# Patient Record
Sex: Female | Born: 1970 | Race: White | Hispanic: No | Marital: Single | Smoking: Never smoker
Health system: Southern US, Community
[De-identification: ages and names within clinical notes are randomized; demographics above are authoritative.]

## PROBLEM LIST (undated history)

## (undated) DIAGNOSIS — E785 Hyperlipidemia, unspecified: Secondary | ICD-10-CM

## (undated) DIAGNOSIS — I10 Essential (primary) hypertension: Secondary | ICD-10-CM

## (undated) HISTORY — DX: Essential (primary) hypertension: I10

## (undated) HISTORY — DX: Hyperlipidemia, unspecified: E78.5

---

## 1997-10-06 ENCOUNTER — Other Ambulatory Visit: Admission: RE | Admit: 1997-10-06 | Discharge: 1997-10-06 | Payer: Self-pay | Admitting: Obstetrics and Gynecology

## 1997-11-11 ENCOUNTER — Other Ambulatory Visit: Admission: RE | Admit: 1997-11-11 | Discharge: 1997-11-11 | Payer: Self-pay | Admitting: Obstetrics and Gynecology

## 1998-05-08 ENCOUNTER — Other Ambulatory Visit: Admission: RE | Admit: 1998-05-08 | Discharge: 1998-05-08 | Payer: Self-pay | Admitting: Obstetrics and Gynecology

## 1998-10-07 ENCOUNTER — Other Ambulatory Visit: Admission: RE | Admit: 1998-10-07 | Discharge: 1998-10-07 | Payer: Self-pay | Admitting: Obstetrics and Gynecology

## 1999-11-29 ENCOUNTER — Other Ambulatory Visit: Admission: RE | Admit: 1999-11-29 | Discharge: 1999-11-29 | Payer: Self-pay | Admitting: *Deleted

## 2000-06-29 ENCOUNTER — Encounter: Payer: Self-pay | Admitting: Emergency Medicine

## 2000-06-29 ENCOUNTER — Emergency Department (HOSPITAL_COMMUNITY): Admission: EM | Admit: 2000-06-29 | Discharge: 2000-06-29 | Payer: Self-pay | Admitting: Emergency Medicine

## 2000-12-26 ENCOUNTER — Other Ambulatory Visit: Admission: RE | Admit: 2000-12-26 | Discharge: 2000-12-26 | Payer: Self-pay | Admitting: Obstetrics and Gynecology

## 2001-12-28 ENCOUNTER — Other Ambulatory Visit: Admission: RE | Admit: 2001-12-28 | Discharge: 2001-12-28 | Payer: Self-pay | Admitting: Obstetrics and Gynecology

## 2002-10-30 ENCOUNTER — Inpatient Hospital Stay (HOSPITAL_COMMUNITY): Admission: AD | Admit: 2002-10-30 | Discharge: 2002-11-01 | Payer: Self-pay | Admitting: Obstetrics and Gynecology

## 2002-12-10 ENCOUNTER — Other Ambulatory Visit: Admission: RE | Admit: 2002-12-10 | Discharge: 2002-12-10 | Payer: Self-pay | Admitting: Obstetrics and Gynecology

## 2005-11-14 ENCOUNTER — Inpatient Hospital Stay (HOSPITAL_COMMUNITY): Admission: AD | Admit: 2005-11-14 | Discharge: 2005-11-16 | Payer: Self-pay | Admitting: Obstetrics and Gynecology

## 2011-10-07 ENCOUNTER — Ambulatory Visit (INDEPENDENT_AMBULATORY_CARE_PROVIDER_SITE_OTHER): Payer: BC Managed Care – PPO | Admitting: Licensed Clinical Social Worker

## 2011-10-07 DIAGNOSIS — F4322 Adjustment disorder with anxiety: Secondary | ICD-10-CM

## 2011-10-24 ENCOUNTER — Ambulatory Visit: Payer: BC Managed Care – PPO | Admitting: Family Medicine

## 2011-10-24 VITALS — BP 154/96 | HR 74 | Temp 98.2°F | Resp 20 | Ht 64.0 in | Wt 140.0 lb

## 2011-10-24 DIAGNOSIS — T1490XA Injury, unspecified, initial encounter: Secondary | ICD-10-CM

## 2011-10-24 DIAGNOSIS — IMO0002 Reserved for concepts with insufficient information to code with codable children: Secondary | ICD-10-CM

## 2011-10-24 MED ORDER — HYDROCODONE-ACETAMINOPHEN 5-500 MG PO TABS: 1.0000 | ORAL_TABLET | Freq: Three times a day (TID) | ORAL | Status: AC | PRN

## 2011-10-24 NOTE — Progress Notes (Signed)
  Subjective:    Patient ID: Heidi Hawkins, female    DOB: 10/23/1970, 41 y.o.   MRN: [id number]  HPI Eye irritation x 4 days.  Pt went to [facility] over the weekend.  Noticed L eye irritation since this point No fevers, chills, headhache, LOV.  Pain worse with eye movement.  No trauma per pt.    Review of Systems See HPI, otherwise ROS negative.     Objective:   Physical Exam  Constitutional: She appears well-developed and well-nourished.  HENT:  Head: Normocephalic and atraumatic.  Eyes:         Noted small foreign body in cornea    Positive for and body elimination on Wood's lamp exam       Assessment & Plan:  Will formally refer patient to ophthalmology for removal Patient given short course of narcotics for pain relief Discuss infectious and ophthalmologic red flags Handout given  I agree with diagnosis and plan Robert P. Sandria Bales.D.

## 2011-10-24 NOTE — Patient Instructions (Signed)
Eye, Foreign Body  The term foreign body refers to any object near, on the surface of or in the eye that should not be there. A foreign body may be a small speck of dirt or dust, a hair or eyelash, a splinter or any object.  CAUSES   Foreign bodies can get in the eye by:   Flying pieces of something that was broken or destroyed (debris).   A sudden injury (trauma) to the eye.  SYMPTOMS   Symptoms depend on what the foreign body is and where it is in the eye. The most common locations are:   On the inner surface of the upper or lower eyelids or on the covering of the white part of the eye (conjunctiva). Symptoms in this location are:   Irritating and painful, especially when blinking.   Feeling like something is in the eye.   On the surface of the clear covering on the front of the eye (cornea). A corneal foreign body has symptoms that:   Are painful and irritating since the cornea is very sensitive.   Form small "rust rings" around a metallic foreign body. Metallic foreign bodies stick more firmly to the surface of the cornea.   Inside the eyeball. Infection can happen fast and can be hard to treat with antibiotics. This is an extremely dangerous situation. Foreign bodies inside the eye can threaten vision. A person may even loose their eye. Foreign bodies inside the eye may cause:   Great pain.   Immediate loss of vision.  DIAGNOSIS   Foreign bodies are found during an exam by an eye specialist. Those that are on the eyelids, conjunctiva or cornea are usually (but not always) easily found. When a foreign body is inside the eyeball, a cataract may form almost right away. This makes it hard for an ophthalmologist to find the foreign body. Special tests may be needed, including ultrasound testing, X-rays and CT scans.  TREATMENT    Foreign bodies that are on the eyelids, conjunctiva or cornea are often removed easily and painlessly.   If the foreign body has caused a scratch or abrasion of the cornea,  antibiotic drops, ointments and/or a tight patch called a "pressure patch" may be needed. Follow-up exams will be needed for several days until the abrasion heals.   Surgery is needed right away if the foreign body is inside the eyeball. This is a medical emergency. An antibiotic therapy will likely be given to stop an infection.  HOME CARE INSTRUCTIONS   The use of eye patches is not universal. Their use varies from state to state and from caregiver to caregiver.  If an eye patch was applied:   Keep the eye patch on for as long as directed by your caregiver until the follow-up appointment.   Do not remove the patch to put in medications unless instructed to do so. When replacing the patch, retape it as it was before. Follow the same procedure if the patch becomes loose.   WARNING: Do not drive or operate machinery while the eye is patched. The ability to judge distances will be impaired.   Only take over-the-counter or prescription medicines for pain, discomfort or fever as directed by the caregiver.  If no eye patch was applied:   Keep the eye closed as much as possible. Do not rub the eye.   Wear dark glasses as needed to protect the eyes from bright light.   Do not wear contact lenses until the eye   feels normal again, or as instructed.   Wear protective eye covering if there is a risk of eye injury. This is important when working with high speed tools.   Only take over-the-counter or prescription medicines for pain, discomfort or fever as directed by the caregiver.  SEEK IMMEDIATE MEDICAL CARE IF:    Pain increases in the eye or the vision changes.   You or your child has problems with the eye patch.   The injury to the eye appears to be getting larger.   There is discharge from the injured eye.   Swelling and/or soreness (inflammation) develops around the affected eye.   You or your child has an oral temperature above 102 F (38.9 C), not controlled by medicine.   Your baby is older than 3  months with a rectal temperature of 102 F (38.9 C) or higher.   Your baby is 3 months old or younger with a rectal temperature of 100.4 F (38 C) or higher.  MAKE SURE YOU:    Understand these instructions.   Will watch your condition.   Will get help right away if you are not doing well or get worse.  Document Released: 05/09/2005 Document Revised: 04/28/2011 Document Reviewed: 12/27/2007  ExitCare Patient Information 2012 [employer].

## 2013-04-04 ENCOUNTER — Other Ambulatory Visit: Payer: Self-pay

## 2013-04-04 DIAGNOSIS — Z1231 Encounter for screening mammogram for malignant neoplasm of breast: Secondary | ICD-10-CM

## 2013-05-03 ENCOUNTER — Ambulatory Visit
Admission: RE | Admit: 2013-05-03 | Discharge: 2013-05-03 | Disposition: A | Discharge status: Home / Self Care | Payer: 59 | Source: Ambulatory Visit

## 2013-05-03 DIAGNOSIS — Z1231 Encounter for screening mammogram for malignant neoplasm of breast: Secondary | ICD-10-CM

## 2016-06-04 DIAGNOSIS — Z23 Encounter for immunization: Secondary | ICD-10-CM | POA: Diagnosis not present

## 2016-11-10 DIAGNOSIS — I10 Essential (primary) hypertension: Secondary | ICD-10-CM | POA: Diagnosis not present

## 2016-12-06 DIAGNOSIS — I10 Essential (primary) hypertension: Secondary | ICD-10-CM | POA: Diagnosis not present

## 2016-12-16 DIAGNOSIS — Z1231 Encounter for screening mammogram for malignant neoplasm of breast: Secondary | ICD-10-CM | POA: Diagnosis not present

## 2016-12-20 ENCOUNTER — Other Ambulatory Visit: Payer: Self-pay | Admitting: Obstetrics and Gynecology

## 2016-12-20 DIAGNOSIS — R928 Other abnormal and inconclusive findings on diagnostic imaging of breast: Secondary | ICD-10-CM

## 2016-12-23 ENCOUNTER — Ambulatory Visit
Admission: RE | Admit: 2016-12-23 | Discharge: 2016-12-23 | Disposition: A | Discharge status: Home / Self Care | Payer: 59 | Source: Ambulatory Visit | Attending: Obstetrics and Gynecology | Admitting: Obstetrics and Gynecology

## 2016-12-23 DIAGNOSIS — R928 Other abnormal and inconclusive findings on diagnostic imaging of breast: Secondary | ICD-10-CM

## 2016-12-23 DIAGNOSIS — R922 Inconclusive mammogram: Secondary | ICD-10-CM | POA: Diagnosis not present

## 2016-12-23 DIAGNOSIS — N6489 Other specified disorders of breast: Secondary | ICD-10-CM | POA: Diagnosis not present

## 2017-02-01 DIAGNOSIS — R8299 Other abnormal findings in urine: Secondary | ICD-10-CM | POA: Diagnosis not present

## 2017-02-01 DIAGNOSIS — I10 Essential (primary) hypertension: Secondary | ICD-10-CM | POA: Diagnosis not present

## 2017-02-01 DIAGNOSIS — N390 Urinary tract infection, site not specified: Secondary | ICD-10-CM | POA: Diagnosis not present

## 2017-02-08 DIAGNOSIS — E784 Other hyperlipidemia: Secondary | ICD-10-CM | POA: Diagnosis not present

## 2017-02-08 DIAGNOSIS — Z1389 Encounter for screening for other disorder: Secondary | ICD-10-CM | POA: Diagnosis not present

## 2017-02-08 DIAGNOSIS — I10 Essential (primary) hypertension: Secondary | ICD-10-CM | POA: Diagnosis not present

## 2017-02-08 DIAGNOSIS — Z0000 Encounter for general adult medical examination without abnormal findings: Secondary | ICD-10-CM | POA: Diagnosis not present

## 2017-06-06 DIAGNOSIS — Z01419 Encounter for gynecological examination (general) (routine) without abnormal findings: Secondary | ICD-10-CM | POA: Diagnosis not present

## 2017-07-14 DIAGNOSIS — D225 Melanocytic nevi of trunk: Secondary | ICD-10-CM | POA: Diagnosis not present

## 2017-07-14 DIAGNOSIS — L821 Other seborrheic keratosis: Secondary | ICD-10-CM | POA: Diagnosis not present

## 2017-07-14 DIAGNOSIS — D2261 Melanocytic nevi of right upper limb, including shoulder: Secondary | ICD-10-CM | POA: Diagnosis not present

## 2017-12-18 DIAGNOSIS — Z1231 Encounter for screening mammogram for malignant neoplasm of breast: Secondary | ICD-10-CM | POA: Diagnosis not present

## 2018-02-12 DIAGNOSIS — R82998 Other abnormal findings in urine: Secondary | ICD-10-CM | POA: Diagnosis not present

## 2018-02-12 DIAGNOSIS — Z0000 Encounter for general adult medical examination without abnormal findings: Secondary | ICD-10-CM | POA: Diagnosis not present

## 2018-02-19 DIAGNOSIS — I10 Essential (primary) hypertension: Secondary | ICD-10-CM | POA: Diagnosis not present

## 2018-02-19 DIAGNOSIS — E7849 Other hyperlipidemia: Secondary | ICD-10-CM | POA: Diagnosis not present

## 2018-02-19 DIAGNOSIS — Z23 Encounter for immunization: Secondary | ICD-10-CM | POA: Diagnosis not present

## 2018-02-19 DIAGNOSIS — Z1389 Encounter for screening for other disorder: Secondary | ICD-10-CM | POA: Diagnosis not present

## 2018-02-19 DIAGNOSIS — Z0000 Encounter for general adult medical examination without abnormal findings: Secondary | ICD-10-CM | POA: Diagnosis not present

## 2018-07-20 DIAGNOSIS — D2261 Melanocytic nevi of right upper limb, including shoulder: Secondary | ICD-10-CM | POA: Diagnosis not present

## 2018-07-20 DIAGNOSIS — D225 Melanocytic nevi of trunk: Secondary | ICD-10-CM | POA: Diagnosis not present

## 2018-07-20 DIAGNOSIS — L821 Other seborrheic keratosis: Secondary | ICD-10-CM | POA: Diagnosis not present

## 2018-08-07 DIAGNOSIS — H0011 Chalazion right upper eyelid: Secondary | ICD-10-CM | POA: Diagnosis not present

## 2019-04-03 ENCOUNTER — Other Ambulatory Visit: Payer: Self-pay

## 2019-04-03 DIAGNOSIS — Z20822 Contact with and (suspected) exposure to covid-19: Secondary | ICD-10-CM

## 2019-04-05 LAB — NOVEL CORONAVIRUS, NAA: SARS-CoV-2, NAA: NOT DETECTED

## 2019-04-29 ENCOUNTER — Other Ambulatory Visit: Payer: Self-pay

## 2019-04-29 DIAGNOSIS — Z20822 Contact with and (suspected) exposure to covid-19: Secondary | ICD-10-CM

## 2019-04-30 LAB — NOVEL CORONAVIRUS, NAA: SARS-CoV-2, NAA: NOT DETECTED

## 2019-05-13 ENCOUNTER — Ambulatory Visit: Payer: 59 | Attending: Internal Medicine

## 2019-05-13 DIAGNOSIS — U071 COVID-19: Secondary | ICD-10-CM

## 2019-05-13 DIAGNOSIS — R238 Other skin changes: Secondary | ICD-10-CM

## 2019-05-14 LAB — NOVEL CORONAVIRUS, NAA: SARS-CoV-2, NAA: NOT DETECTED

## 2019-06-10 ENCOUNTER — Ambulatory Visit: Payer: 59 | Attending: Internal Medicine

## 2019-06-10 ENCOUNTER — Other Ambulatory Visit: Payer: 59

## 2019-06-10 DIAGNOSIS — Z20822 Contact with and (suspected) exposure to covid-19: Secondary | ICD-10-CM

## 2019-06-12 LAB — NOVEL CORONAVIRUS, NAA: SARS-CoV-2, NAA: NOT DETECTED

## 2019-10-18 ENCOUNTER — Other Ambulatory Visit: Payer: Self-pay | Admitting: Obstetrics and Gynecology

## 2019-10-18 DIAGNOSIS — N6002 Solitary cyst of left breast: Secondary | ICD-10-CM

## 2019-11-04 ENCOUNTER — Other Ambulatory Visit: Payer: Self-pay

## 2019-11-04 ENCOUNTER — Ambulatory Visit
Admission: RE | Admit: 2019-11-04 | Discharge: 2019-11-04 | Disposition: A | Discharge status: Home / Self Care | Payer: 59 | Source: Ambulatory Visit | Attending: Obstetrics and Gynecology | Admitting: Obstetrics and Gynecology

## 2019-11-04 ENCOUNTER — Other Ambulatory Visit: Payer: Self-pay | Admitting: Obstetrics and Gynecology

## 2019-11-04 DIAGNOSIS — N6002 Solitary cyst of left breast: Secondary | ICD-10-CM

## 2019-11-07 ENCOUNTER — Other Ambulatory Visit: Payer: Self-pay

## 2019-11-07 ENCOUNTER — Ambulatory Visit
Admission: RE | Admit: 2019-11-07 | Discharge: 2019-11-07 | Disposition: A | Discharge status: Home / Self Care | Payer: 59 | Source: Ambulatory Visit | Attending: Obstetrics and Gynecology | Admitting: Obstetrics and Gynecology

## 2019-11-07 DIAGNOSIS — N6002 Solitary cyst of left breast: Secondary | ICD-10-CM

## 2020-03-27 DIAGNOSIS — Z1152 Encounter for screening for COVID-19: Secondary | ICD-10-CM | POA: Diagnosis not present

## 2020-04-15 DIAGNOSIS — Z1212 Encounter for screening for malignant neoplasm of rectum: Secondary | ICD-10-CM | POA: Diagnosis not present

## 2020-08-25 DIAGNOSIS — E785 Hyperlipidemia, unspecified: Secondary | ICD-10-CM | POA: Diagnosis not present

## 2020-08-25 DIAGNOSIS — I10 Essential (primary) hypertension: Secondary | ICD-10-CM | POA: Diagnosis not present

## 2020-10-12 IMAGING — MG DIGITAL DIAGNOSTIC BILAT W/ TOMO W/ CAD
6 of 10 series · 6 of 30 positions shown · non-contrast
Comparison: Previous exam(s).

CLINICAL DATA: 48-year-old presenting with a painless palpable lump
involving the OUTER LEFT breast for approximately 1 month. Personal
history of LEFT breast cysts. Annual evaluation, RIGHT breast.

EXAM:
DIGITAL DIAGNOSTIC BILATERAL MAMMOGRAM WITH CAD AND TOMO
ULTRASOUND LEFT BREAST

[L MLO synth-2D]
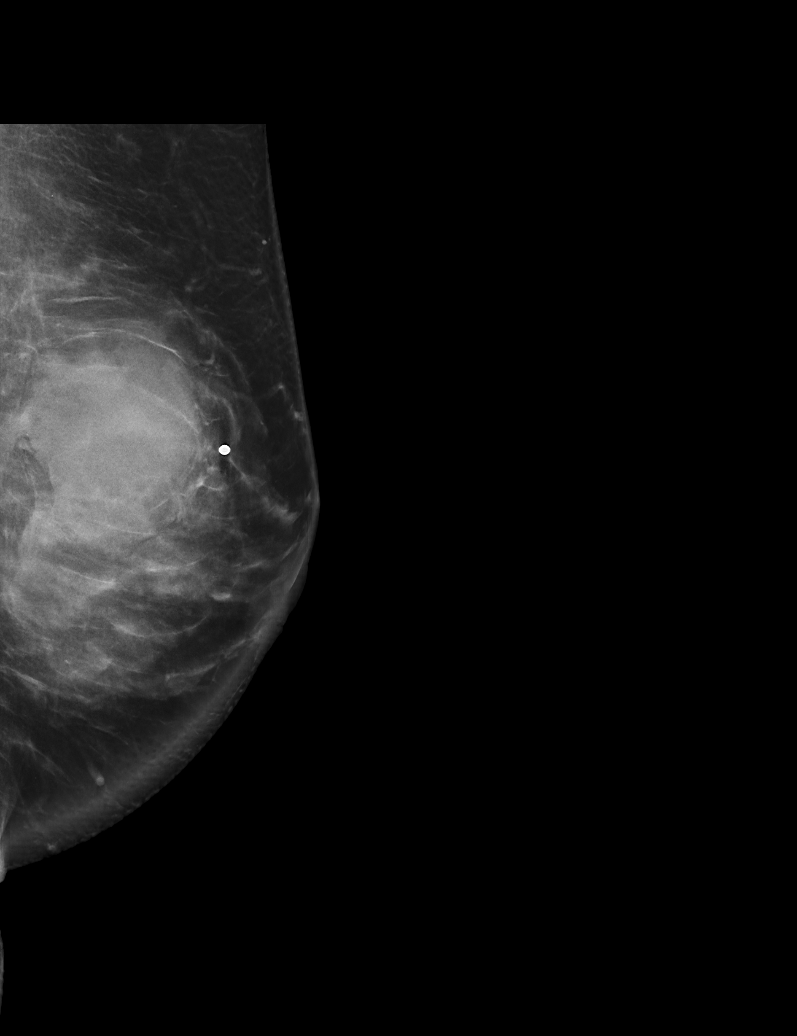

[L CC synth-2D (1 of 2)]
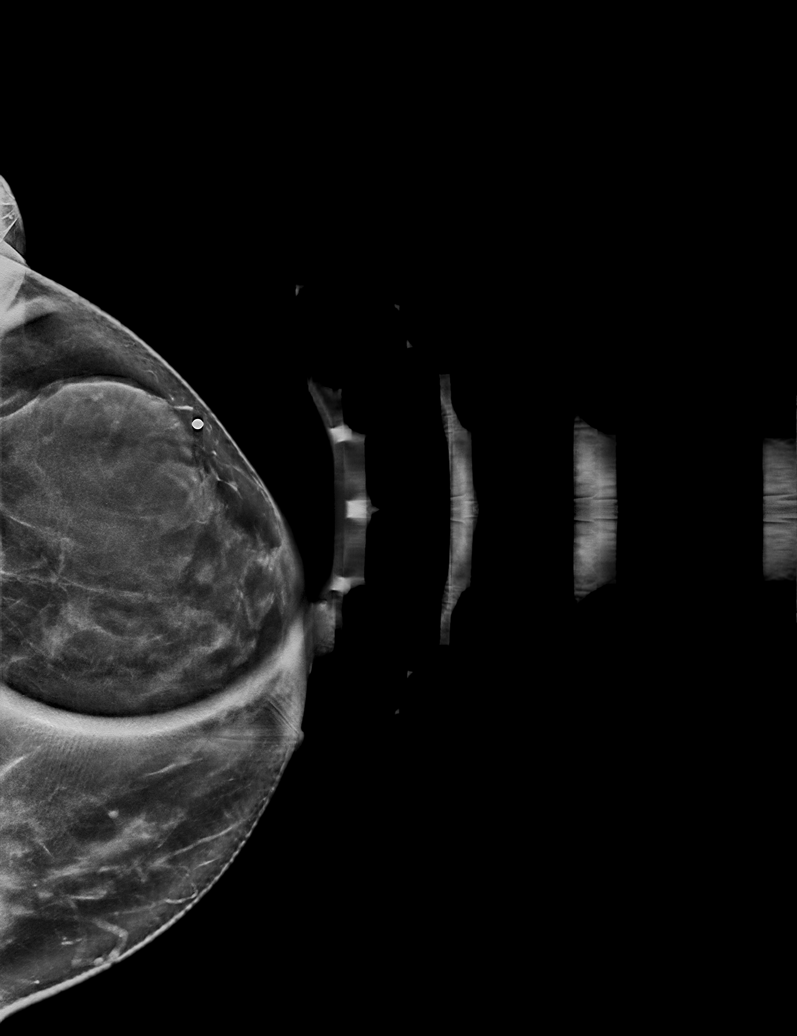

[R MLO synth-2D]
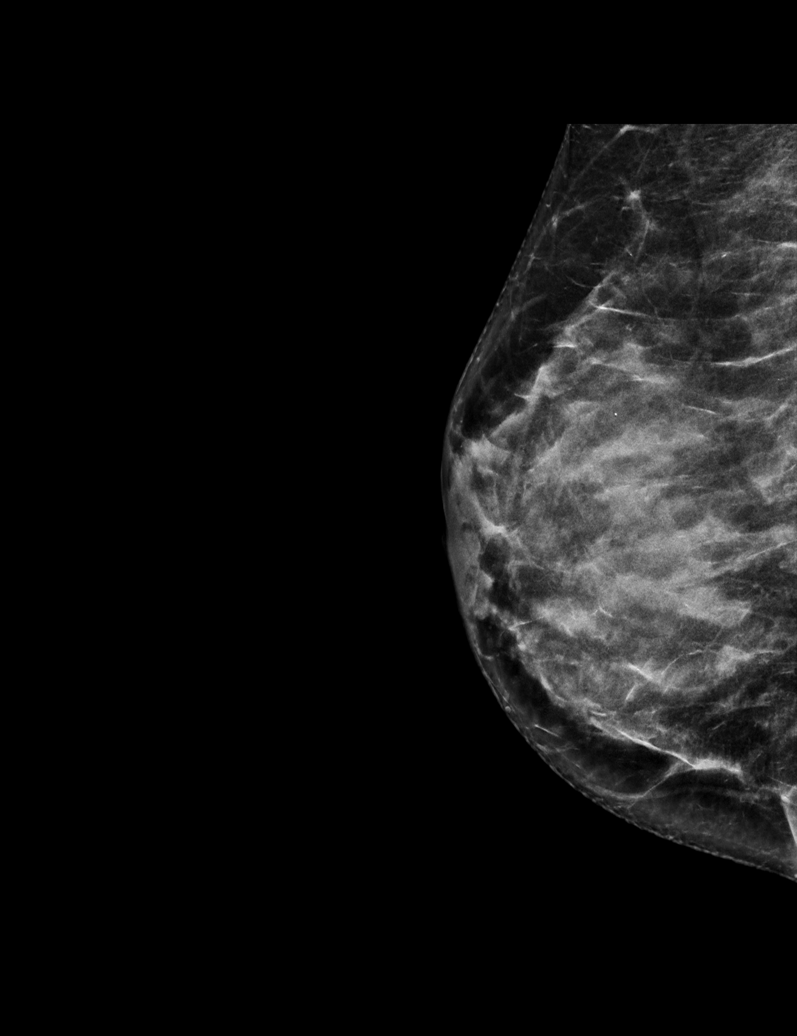

[L CC synth-2D (2 of 2)]
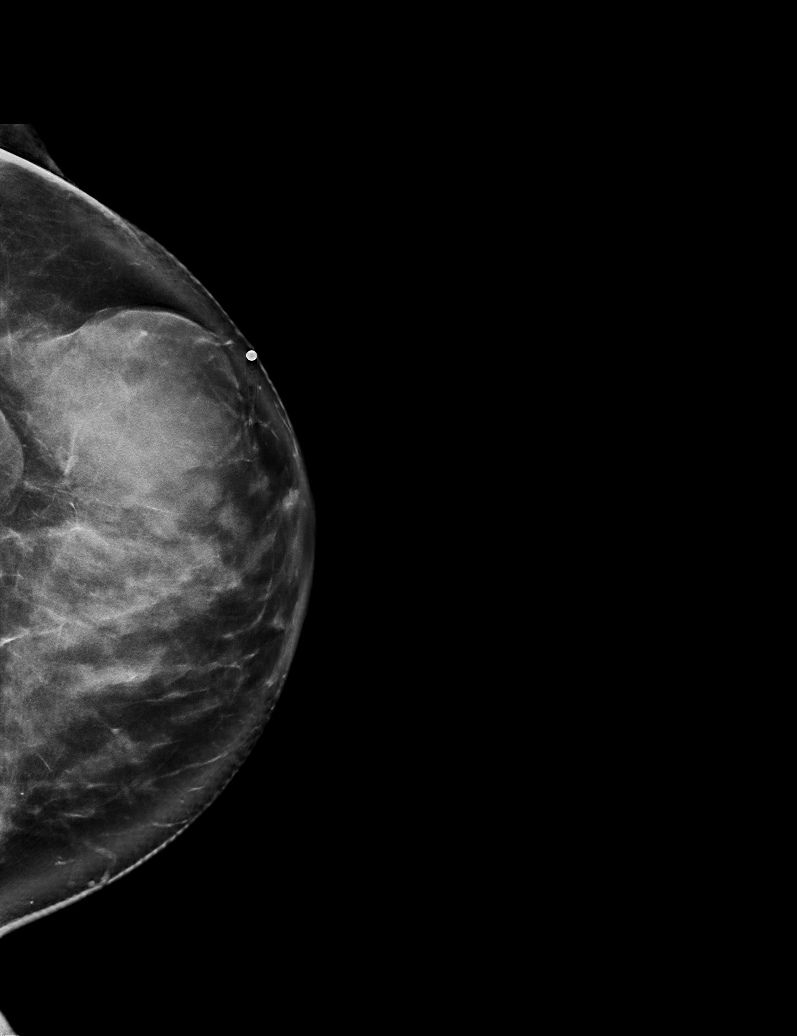

[R CC synth-2D]
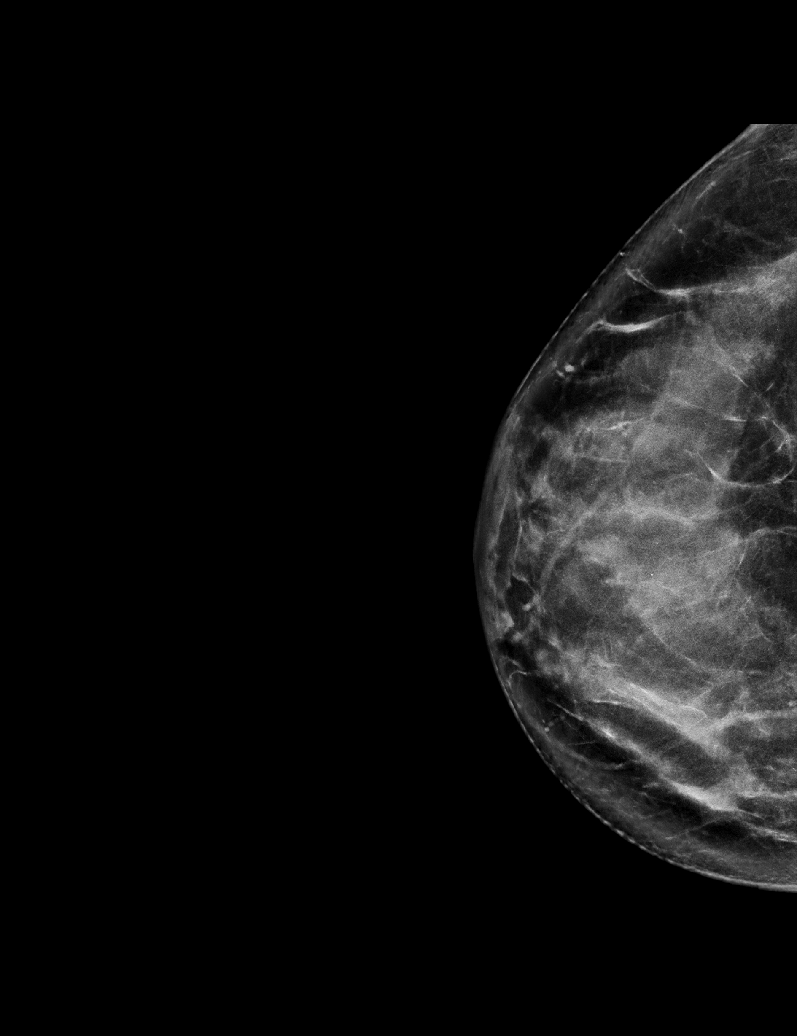

[R MLO tomo · tomo slice 31/61.0]
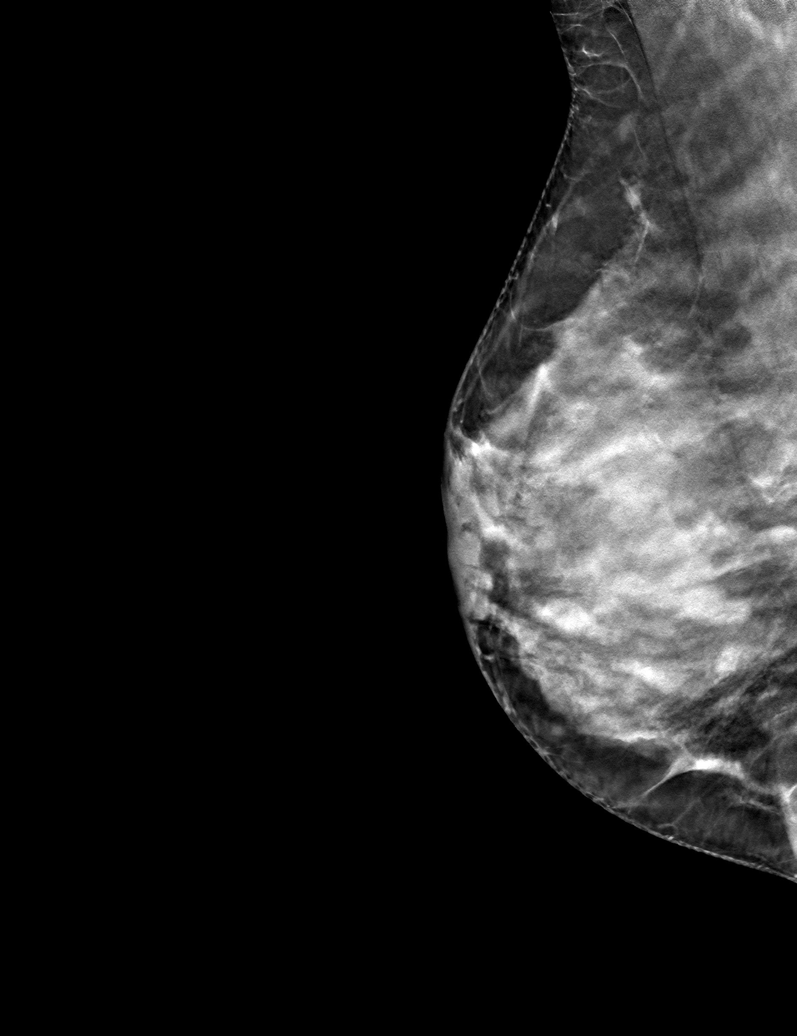

[6 of 30 positions shown; findings below may reference images not displayed]

ACR Breast Density Category d: The breast tissue is extremely dense,
which lowers the sensitivity of mammography.
FINDINGS: Tomosynthesis and synthesized full field CC and MLO views of both
breasts were obtained. Tomosynthesis and synthesized spot
compression tangential view of the area of concern in the LEFT
breast was also obtained.

Corresponding to the palpable concern in the OUTER LEFT breast is a
partially obscured isodense mass measuring approximately 5 cm whose
visible margins are circumscribed. There is no associated
architectural distortion or suspicious calcifications. This is in a
similar location to the previously identified cysts in 0182. No
suspicious findings elsewhere in the LEFT breast.

No findings suspicious for malignancy in the RIGHT breast.

Mammographic images were processed with CAD.

On correlative physical exam, there is a freely mobile palpable
approximate 4-5 cm mass in the OUTER RIGHT breast which causes a
visible bulge on the patient's skin.

Targeted LEFT breast ultrasound is performed, showing a benign
simple cyst at the 3 o'clock position approximately 4 cm from nipple
extending from anterior to posterior depth, measuring approximately
4.6 x 2.5 x 4.0 cm, demonstrating posterior acoustic enhancement and
no internal color Doppler flow, corresponding to palpable concern.
No suspicious solid mass is identified.
IMPRESSION: 1. No mammographic or sonographic evidence of malignancy involving
the LEFT breast.
2. No mammographic evidence of malignancy involving the RIGHT
breast.
3. Large benign simple cyst involving the OUTER LEFT breast,
measuring approximately 4.6 cm, accounting for the palpable concern.

RECOMMENDATION:
1.  Screening mammogram in one year.(Code:Z3-L-EMU)
2. Due to the large size of the LEFT breast cyst, the patient
requests cyst aspiration. This has been scheduled at her
convenience.

I have discussed the findings and recommendations with the patient.
If applicable, a reminder letter will be sent to the patient
regarding the next appointment.

BI-RADS CATEGORY  2: Benign.

## 2020-11-02 DIAGNOSIS — Z1231 Encounter for screening mammogram for malignant neoplasm of breast: Secondary | ICD-10-CM | POA: Diagnosis not present

## 2020-11-02 LAB — HM MAMMOGRAPHY

## 2021-03-31 DIAGNOSIS — E785 Hyperlipidemia, unspecified: Secondary | ICD-10-CM | POA: Diagnosis not present

## 2021-03-31 DIAGNOSIS — Z0000 Encounter for general adult medical examination without abnormal findings: Secondary | ICD-10-CM | POA: Diagnosis not present

## 2021-04-07 DIAGNOSIS — Z23 Encounter for immunization: Secondary | ICD-10-CM | POA: Diagnosis not present

## 2021-04-07 DIAGNOSIS — Z1331 Encounter for screening for depression: Secondary | ICD-10-CM | POA: Diagnosis not present

## 2021-04-07 DIAGNOSIS — R82998 Other abnormal findings in urine: Secondary | ICD-10-CM | POA: Diagnosis not present

## 2021-04-07 DIAGNOSIS — I10 Essential (primary) hypertension: Secondary | ICD-10-CM | POA: Diagnosis not present

## 2021-04-07 DIAGNOSIS — Z0000 Encounter for general adult medical examination without abnormal findings: Secondary | ICD-10-CM | POA: Diagnosis not present

## 2021-04-07 DIAGNOSIS — Z1339 Encounter for screening examination for other mental health and behavioral disorders: Secondary | ICD-10-CM | POA: Diagnosis not present

## 2021-09-30 ENCOUNTER — Encounter: Payer: Self-pay | Admitting: Internal Medicine

## 2021-11-25 ENCOUNTER — Encounter: Payer: Self-pay | Admitting: Internal Medicine

## 2021-12-16 ENCOUNTER — Ambulatory Visit (AMBULATORY_SURGERY_CENTER): Payer: Self-pay | Admitting: *Deleted

## 2021-12-16 VITALS — Ht 64.0 in | Wt 145.0 lb

## 2021-12-16 DIAGNOSIS — Z1231 Encounter for screening mammogram for malignant neoplasm of breast: Secondary | ICD-10-CM | POA: Diagnosis not present

## 2021-12-16 DIAGNOSIS — Z1211 Encounter for screening for malignant neoplasm of colon: Secondary | ICD-10-CM

## 2021-12-16 DIAGNOSIS — Z124 Encounter for screening for malignant neoplasm of cervix: Secondary | ICD-10-CM | POA: Diagnosis not present

## 2021-12-16 DIAGNOSIS — Z01419 Encounter for gynecological examination (general) (routine) without abnormal findings: Secondary | ICD-10-CM | POA: Diagnosis not present

## 2021-12-16 DIAGNOSIS — Z6824 Body mass index (BMI) 24.0-24.9, adult: Secondary | ICD-10-CM | POA: Diagnosis not present

## 2021-12-16 LAB — HM MAMMOGRAPHY

## 2021-12-16 MED ORDER — NA SULFATE-K SULFATE-MG SULF 17.5-3.13-1.6 GM/177ML PO SOLN: 1.0000 | Freq: Once | ORAL | 0 refills | Status: AC

## 2021-12-16 NOTE — Progress Notes (Signed)
No egg or soy allergy known to patient  No issues known to pt with past sedation with any surgeries or procedures Patient denies ever being told they had issues or difficulty with intubation  No FH of Malignant Hyperthermia Pt is not on diet pills Pt is not on  home 02  Pt is not on blood thinners  Pt denies issues with constipation  No A fib or A flutter Have any cardiac testing pending--no Pt instructed to use [email or web address] or GoodRx for a price reduction on prep   

## 2021-12-23 LAB — HM PAP SMEAR

## 2021-12-27 ENCOUNTER — Telehealth: Payer: Self-pay | Admitting: Internal Medicine

## 2021-12-27 NOTE — Telephone Encounter (Signed)
Done

## 2021-12-27 NOTE — Telephone Encounter (Signed)
Patient rescheduled her colonoscopy and requested new instructions to be sent through her [facility]

## 2021-12-29 ENCOUNTER — Encounter: Payer: Self-pay | Admitting: Internal Medicine

## 2022-01-05 ENCOUNTER — Encounter: Payer: Self-pay | Admitting: Internal Medicine

## 2022-01-05 ENCOUNTER — Ambulatory Visit (AMBULATORY_SURGERY_CENTER): Payer: BC Managed Care – PPO | Admitting: Internal Medicine

## 2022-01-05 VITALS — BP 129/84 | HR 69 | Temp 98.4°F | Resp 13 | Ht 64.0 in | Wt 145.0 lb

## 2022-01-05 DIAGNOSIS — Z1211 Encounter for screening for malignant neoplasm of colon: Secondary | ICD-10-CM

## 2022-01-05 DIAGNOSIS — R933 Abnormal findings on diagnostic imaging of other parts of digestive tract: Secondary | ICD-10-CM | POA: Diagnosis not present

## 2022-01-05 MED ORDER — SODIUM CHLORIDE 0.9 % IV SOLN: 500.0000 mL | Freq: Once | INTRAVENOUS | Status: DC

## 2022-01-05 NOTE — Progress Notes (Signed)
Report given to PACU, vss 

## 2022-01-05 NOTE — Progress Notes (Signed)
GASTROENTEROLOGY PROCEDURE H&P NOTE   Primary Care Physician: Ginger Organ., MD    Reason for Procedure:  Colon cancer screening  Plan:    Colonoscopy  Patient is appropriate for endoscopic procedure(s) in the ambulatory ([place]) setting.  The nature of the procedure, as well as the risks, benefits, and alternatives were carefully and thoroughly reviewed with the patient. Ample time for discussion and questions allowed. The patient understood, was satisfied, and agreed to proceed.     HPI: Heidi Hawkins is a 51 y.o. female who presents for screening colonoscopy.  Medical history as below.  Tolerated the prep.  No recent chest pain or shortness of breath.  No abdominal pain today.  Past Medical History:  Diagnosis Date   Hyperlipidemia    Hypertension     History reviewed. No pertinent surgical history.  Prior to Admission medications   Medication Sig Start Date End Date Taking? Authorizing Provider  hydrochlorothiazide (HYDRODIURIL) 12.5 MG tablet 1 tablet in the morning Oral Once a day for 90 days   Yes [provider]  olmesartan (BENICAR) 20 MG tablet TAKE 1 TABLET BY MOUTH EVERY DAY for 90 days   Yes [provider]  ibuprofen (ADVIL,MOTRIN) 100 MG chewable tablet Chew 200 mg by mouth every 8 (eight) hours as needed.    [provider]    Current Outpatient Medications  Medication Sig Dispense Refill   hydrochlorothiazide (HYDRODIURIL) 12.5 MG tablet 1 tablet in the morning Oral Once a day for 90 days     olmesartan (BENICAR) 20 MG tablet TAKE 1 TABLET BY MOUTH EVERY DAY for 90 days     ibuprofen (ADVIL,MOTRIN) 100 MG chewable tablet Chew 200 mg by mouth every 8 (eight) hours as needed.     Current Facility-Administered Medications  Medication Dose Route Frequency Provider Last Rate Last Admin   0.9 %  sodium chloride infusion  500 mL Intravenous Once Eustacio Ellen, Lajuan Lines, MD        Allergies as of 01/05/2022 - Review Complete  01/05/2022  Allergen Reaction Noted   Allegra [fexofenadine hcl] Swelling 10/24/2011   Claritin [loratadine] Swelling 10/24/2011   Penicillins  10/24/2011   Sulfa antibiotics  10/24/2011    Family History  Problem Relation Age of Onset   Colon cancer Neg Hx    Stomach cancer Neg Hx    Esophageal cancer Neg Hx     Social History   Socioeconomic History   Marital status: Single    Spouse name: Not on file   Number of children: Not on file   Years of education: Not on file   Highest education level: Not on file  Occupational History   Not on file  Tobacco Use   Smoking status: Never   Smokeless tobacco: Not on file  Substance and Sexual Activity   Alcohol use: Yes    Alcohol/week: 1.0 standard drink of alcohol    Types: 1 Glasses of wine per week   Drug use: Not Currently   Sexual activity: Not on file  Other Topics Concern   Not on file  Social History Narrative   Not on file   Social Determinants of Health   Financial Resource Strain: Not on file  Food Insecurity: Not on file  Transportation Needs: Not on file  Physical Activity: Not on file  Stress: Not on file  Social Connections: Not on file  Intimate Partner Violence: Not on file    Physical Exam: Vital signs in last  24 hours: '@BP'$  128/70   Pulse 87   Temp 98.4 F (36.9 C)   Ht '5\' 4"'$  (1.626 m)   Wt 145 lb (65.8 kg)   SpO2 97%   BMI 24.89 kg/m  GEN: NAD EYE: Sclerae anicteric ENT: MMM CV: Non-tachycardic Pulm: CTA b/l GI: Soft, NT/ND NEURO:  Alert & Oriented x 3   Zenovia Jarred, MD [facility]  01/05/2022 2:54 PM

## 2022-01-05 NOTE — Progress Notes (Signed)
Pt's states no medical or surgical changes since previsit or office visit. 

## 2022-01-05 NOTE — Patient Instructions (Signed)
Repeat colonoscopy in 10 years for screening purposes.   YOU HAD AN ENDOSCOPIC PROCEDURE TODAY AT [facility]:   Refer to the procedure report that was given to you for any specific questions about what was found during the examination.  If the procedure report does not answer your questions, please call your gastroenterologist to clarify.  If you requested that your care partner not be given the details of your procedure findings, then the procedure report has been included in a sealed envelope for you to review at your convenience later.  YOU SHOULD EXPECT: Some feelings of bloating in the abdomen. Passage of more gas than usual.  Walking can help get rid of the air that was put into your GI tract during the procedure and reduce the bloating. If you had a lower endoscopy (such as a colonoscopy or flexible sigmoidoscopy) you may notice spotting of blood in your stool or on the toilet paper. If you underwent a bowel prep for your procedure, you may not have a normal bowel movement for a few days.  Please Note:  You might notice some irritation and congestion in your nose or some drainage.  This is from the oxygen used during your procedure.  There is no need for concern and it should clear up in a day or so.  SYMPTOMS TO REPORT IMMEDIATELY:  Following lower endoscopy (colonoscopy or flexible sigmoidoscopy):  Excessive amounts of blood in the stool  Significant tenderness or worsening of abdominal pains  Swelling of the abdomen that is new, acute  Fever of 100F or higher  For urgent or emergent issues, a gastroenterologist can be reached at any hour by calling [phone]. Do not use MyChart messaging for urgent concerns.    DIET:  We do recommend a small meal at first, but then you may proceed to your regular diet.  Drink plenty of fluids but you should avoid alcoholic beverages for 24 hours.  ACTIVITY:  You should plan to take it easy for the rest of today and you should  NOT DRIVE or use heavy machinery until tomorrow (because of the sedation medicines used during the test).    FOLLOW UP: Our staff will call the number listed on your records the next business day following your procedure.  We will call around 7:15- 8:00 am to check on you and address any questions or concerns that you may have regarding the information given to you following your procedure. If we do not reach you, we will leave a message.  If you develop any symptoms (ie: fever, flu-like symptoms, shortness of breath, cough etc.) before then, please call [phone].  If you test positive for Covid 19 in the 2 weeks post procedure, please call and report this information to [place].    If any biopsies were taken you will be contacted by phone or by letter within the next 1-3 weeks.  Please call us at [phone] if you have not heard about the biopsies in 3 weeks.    SIGNATURES/CONFIDENTIALITY: You and/or your care partner have signed paperwork which will be entered into your electronic medical record.  These signatures attest to the fact that that the information above on your After Visit Summary has been reviewed and is understood.  Full responsibility of the confidentiality of this discharge information lies with you and/or your care-partner.

## 2022-01-05 NOTE — Op Note (Signed)
[place] Patient Name: Heidi Hawkins Procedure Date: 01/05/2022 2:56 PM MRN: [id number] Endoscopist: Jerene Bears , MD Age: 51 Referring MD:  Date of Birth: Aug 21, 1970 Gender: Female Account #: [id number] Procedure:                Colonoscopy Indications:              Screening for colorectal malignant neoplasm, This                            is the patient's first colonoscopy Medicines:                Monitored Anesthesia Care Procedure:                Pre-Anesthesia Assessment:                           - Prior to the procedure, a History and Physical                            was performed, and patient medications and                            allergies were reviewed. The patient's tolerance of                            previous anesthesia was also reviewed. The risks                            and benefits of the procedure and the sedation                            options and risks were discussed with the patient.                            All questions were answered, and informed consent                            was obtained. Prior Anticoagulants: The patient has                            taken no previous anticoagulant or antiplatelet                            agents. ASA Grade Assessment: II - A patient with                            mild systemic disease. After reviewing the risks                            and benefits, the patient was deemed in                            satisfactory condition to undergo the procedure.  After obtaining informed consent, the colonoscope                            was passed under direct vision. Throughout the                            procedure, the patient's blood pressure, pulse, and                            oxygen saturations were monitored continuously. The                            PCF-HQ190L Colonoscope was introduced through the                            anus and advanced to  the cecum, identified by                            appendiceal orifice and ileocecal valve. The                            colonoscopy was performed without difficulty. The                            patient tolerated the procedure well. The quality                            of the bowel preparation was good. The ileocecal                            valve, appendiceal orifice, and rectum were                            photographed. Scope In: 3:02:37 PM Scope Out: 3:17:37 PM Scope Withdrawal Time: 0 hours 11 minutes 23 seconds  Total Procedure Duration: 0 hours 15 minutes 0 seconds  Findings:                 The digital rectal exam was normal.                           The entire examined colon appeared normal on direct                            and retroflexion views. Complications:            No immediate complications. Estimated Blood Loss:     Estimated blood loss: none. Impression:               - The entire examined colon is normal on direct and                            retroflexion views.                           - No specimens collected. Recommendation:           -  Patient has a contact number available for                            emergencies. The signs and symptoms of potential                            delayed complications were discussed with the                            patient. Return to normal activities tomorrow.                            Written discharge instructions were provided to the                            patient.                           - Resume previous diet.                           - Continue present medications.                           - Repeat colonoscopy in 10 years for screening                            purposes. Jerene Bears, MD 01/05/2022 3:20:27 PM This report has been signed electronically.

## 2022-01-06 ENCOUNTER — Encounter: Payer: 59 | Admitting: Internal Medicine

## 2022-01-06 ENCOUNTER — Telehealth: Payer: Self-pay

## 2022-01-06 NOTE — Telephone Encounter (Signed)
  Follow up Call-     01/05/2022    2:16 PM  Call back number  Post procedure Call Back phone  # [phone]  Permission to leave phone message Yes    Follow-up call, LVM.

## 2022-03-16 DIAGNOSIS — L814 Other melanin hyperpigmentation: Secondary | ICD-10-CM | POA: Diagnosis not present

## 2022-03-16 DIAGNOSIS — L821 Other seborrheic keratosis: Secondary | ICD-10-CM | POA: Diagnosis not present

## 2022-09-12 DIAGNOSIS — E785 Hyperlipidemia, unspecified: Secondary | ICD-10-CM | POA: Diagnosis not present

## 2022-09-12 DIAGNOSIS — R7989 Other specified abnormal findings of blood chemistry: Secondary | ICD-10-CM | POA: Diagnosis not present

## 2022-09-12 DIAGNOSIS — I10 Essential (primary) hypertension: Secondary | ICD-10-CM | POA: Diagnosis not present

## 2022-09-19 DIAGNOSIS — I10 Essential (primary) hypertension: Secondary | ICD-10-CM | POA: Diagnosis not present

## 2022-09-19 DIAGNOSIS — Z1331 Encounter for screening for depression: Secondary | ICD-10-CM | POA: Diagnosis not present

## 2022-09-19 DIAGNOSIS — Z0000 Encounter for general adult medical examination without abnormal findings: Secondary | ICD-10-CM | POA: Diagnosis not present

## 2022-09-19 DIAGNOSIS — R82998 Other abnormal findings in urine: Secondary | ICD-10-CM | POA: Diagnosis not present

## 2022-09-19 DIAGNOSIS — Z1339 Encounter for screening examination for other mental health and behavioral disorders: Secondary | ICD-10-CM | POA: Diagnosis not present

## 2023-03-09 DIAGNOSIS — Z1231 Encounter for screening mammogram for malignant neoplasm of breast: Secondary | ICD-10-CM | POA: Diagnosis not present

## 2023-03-09 DIAGNOSIS — Z1331 Encounter for screening for depression: Secondary | ICD-10-CM | POA: Diagnosis not present

## 2023-03-09 DIAGNOSIS — Z01419 Encounter for gynecological examination (general) (routine) without abnormal findings: Secondary | ICD-10-CM | POA: Diagnosis not present

## 2023-03-09 DIAGNOSIS — Z30431 Encounter for routine checking of intrauterine contraceptive device: Secondary | ICD-10-CM | POA: Diagnosis not present

## 2023-03-09 LAB — HM MAMMOGRAPHY

## 2023-03-14 DIAGNOSIS — H5203 Hypermetropia, bilateral: Secondary | ICD-10-CM | POA: Diagnosis not present

## 2023-05-03 DIAGNOSIS — D225 Melanocytic nevi of trunk: Secondary | ICD-10-CM | POA: Diagnosis not present

## 2023-05-03 DIAGNOSIS — D2262 Melanocytic nevi of left upper limb, including shoulder: Secondary | ICD-10-CM | POA: Diagnosis not present

## 2023-05-03 DIAGNOSIS — L821 Other seborrheic keratosis: Secondary | ICD-10-CM | POA: Diagnosis not present

## 2023-05-03 DIAGNOSIS — D2261 Melanocytic nevi of right upper limb, including shoulder: Secondary | ICD-10-CM | POA: Diagnosis not present

## 2023-09-27 DIAGNOSIS — E785 Hyperlipidemia, unspecified: Secondary | ICD-10-CM | POA: Diagnosis not present

## 2023-10-04 DIAGNOSIS — Z1331 Encounter for screening for depression: Secondary | ICD-10-CM | POA: Diagnosis not present

## 2023-10-04 DIAGNOSIS — R82998 Other abnormal findings in urine: Secondary | ICD-10-CM | POA: Diagnosis not present

## 2023-10-04 DIAGNOSIS — E785 Hyperlipidemia, unspecified: Secondary | ICD-10-CM | POA: Diagnosis not present

## 2023-10-04 DIAGNOSIS — Z0000 Encounter for general adult medical examination without abnormal findings: Secondary | ICD-10-CM | POA: Diagnosis not present

## 2023-10-04 DIAGNOSIS — I10 Essential (primary) hypertension: Secondary | ICD-10-CM | POA: Diagnosis not present

## 2023-10-04 DIAGNOSIS — Z1339 Encounter for screening examination for other mental health and behavioral disorders: Secondary | ICD-10-CM | POA: Diagnosis not present

## 2023-12-06 ENCOUNTER — Ambulatory Visit: Admitting: Radiology

## 2023-12-06 ENCOUNTER — Encounter: Payer: Self-pay | Admitting: Radiology

## 2023-12-06 VITALS — BP 114/72 | HR 80 | Ht 64.25 in | Wt 149.4 lb

## 2023-12-06 DIAGNOSIS — N951 Menopausal and female climacteric states: Secondary | ICD-10-CM

## 2023-12-06 MED ORDER — PROGESTERONE MICRONIZED 100 MG PO CAPS: 100.0000 mg | ORAL_CAPSULE | Freq: Every day | ORAL | 1 refills | Status: DC

## 2023-12-06 MED ORDER — ESTRADIOL 0.0375 MG/24HR TD PTTW: 1.0000 | MEDICATED_PATCH | TRANSDERMAL | 1 refills | Status: DC

## 2023-12-06 NOTE — Progress Notes (Signed)
   Heidi Hawkins [birth year] [id number]   History:  53 y.o. G2P2 presents as a new patient. Previous patient of Dr Fawn. Interested in HRT for perimenopausal brain fog, hot at night, occasional anxiety and preventative benefits.  Gynecologic History No LMP recorded. (Menstrual status: IUD).   Contraception/Family planning: IUD Sexually active: yes  Last Pap: ?2023 or 2024 Last mammogram: 2024 [facility]  Obstetric History OB History  Gravida Para Term Preterm AB Living  2 2    2   SAB IAB Ectopic Multiple Live Births          # Outcome Date GA Lbr Len/2nd Weight Sex Type Anes PTL Lv  2 Para           1 Para                The following portions of the patient's history were reviewed and updated as appropriate: allergies, current medications, past family history, past medical history, past social history, past surgical history, and problem list.  Review of Systems  All other systems reviewed and are negative.   Past medical history, past surgical history, family history and social history were all reviewed and documented in the EPIC chart.  Exam:  Vitals:   12/06/23 1412  BP: 114/72  Pulse: 80  SpO2: 96%  Weight: 149 lb 6.4 oz (67.8 kg)  Height: 5' 4.25 (1.632 m)   Body mass index is 25.45 kg/m.  Physical Exam Vitals and nursing note reviewed.  Constitutional:      Appearance: Normal appearance. She is normal weight.  Pulmonary:     Effort: Pulmonary effort is normal.  Neurological:     Mental Status: She is alert.  Psychiatric:        Mood and Affect: Mood normal.        Thought Content: Thought content normal.        Judgment: Judgment normal.     Assessment/Plan:   1. Perimenopausal symptoms (Primary) Risks and benefits reviewed. - estradiol  (VIVELLE -DOT) 0.0375 MG/24HR; Place 1 patch onto the skin 2 (two) times a week.  Dispense: 24 patch; Refill: 1 - progesterone  (PROMETRIUM ) 100 MG capsule; Take 1 capsule (100 mg total) by mouth daily.   Dispense: 90 capsule; Refill: 1    Return in about 16 weeks (around 03/27/2024) for Annual.  GINETTE COZIER B WHNP-BC 2:41 PM 12/06/2023

## 2023-12-22 ENCOUNTER — Other Ambulatory Visit: Payer: Self-pay

## 2023-12-22 DIAGNOSIS — N951 Menopausal and female climacteric states: Secondary | ICD-10-CM

## 2023-12-22 MED ORDER — ESTRADIOL 0.0375 MG/24HR TD PTTW: 1.0000 | MEDICATED_PATCH | TRANSDERMAL | 1 refills | Status: DC

## 2023-12-22 NOTE — Telephone Encounter (Signed)
 Med refill request: estradiol  0.0375 mg patch Patient has new mail order pharmacy, [facility] Last OV: 12/06/23 Next AEX: 03/21/24 Last MMG (if hormonal med) 02/2023 per patient  Refill authorized: Please Advise?

## 2023-12-24 DIAGNOSIS — N951 Menopausal and female climacteric states: Secondary | ICD-10-CM

## 2023-12-26 MED ORDER — PROGESTERONE MICRONIZED 100 MG PO CAPS: 100.0000 mg | ORAL_CAPSULE | Freq: Every day | ORAL | 1 refills | Status: DC

## 2023-12-26 NOTE — Telephone Encounter (Signed)
 Spoke with Tatianne at [facility]. Prometrium  100 mg Rx cancelled.   Rx pended with updated pharmacy.

## 2024-03-15 ENCOUNTER — Ambulatory Visit: Admitting: Radiology

## 2024-03-21 ENCOUNTER — Encounter: Payer: Self-pay | Admitting: Radiology

## 2024-03-21 ENCOUNTER — Ambulatory Visit: Admitting: Radiology

## 2024-03-21 VITALS — BP 98/64 | HR 83 | Ht 65.5 in | Wt 150.0 lb

## 2024-03-21 DIAGNOSIS — N951 Menopausal and female climacteric states: Secondary | ICD-10-CM

## 2024-03-21 DIAGNOSIS — Z01419 Encounter for gynecological examination (general) (routine) without abnormal findings: Secondary | ICD-10-CM

## 2024-03-21 DIAGNOSIS — Z1331 Encounter for screening for depression: Secondary | ICD-10-CM | POA: Diagnosis not present

## 2024-03-21 DIAGNOSIS — Z30433 Encounter for removal and reinsertion of intrauterine contraceptive device: Secondary | ICD-10-CM

## 2024-03-21 DIAGNOSIS — N882 Stricture and stenosis of cervix uteri: Secondary | ICD-10-CM

## 2024-03-21 MED ORDER — PROGESTERONE MICRONIZED 100 MG PO CAPS: 100.0000 mg | ORAL_CAPSULE | Freq: Every day | ORAL | 4 refills | Status: DC

## 2024-03-21 MED ORDER — ESTRADIOL 0.0375 MG/24HR TD PTTW: 1.0000 | MEDICATED_PATCH | TRANSDERMAL | 4 refills | Status: DC

## 2024-03-21 MED ORDER — MISOPROSTOL 200 MCG PO TABS: 400.0000 ug | ORAL_TABLET | Freq: Once | ORAL | 0 refills | Status: AC

## 2024-03-21 NOTE — Progress Notes (Signed)
 Heidi Hawkins Jun 26, 1970 [id number]   History:  53 y.o. G2P2 presents for annual exam. Due for IUD exchange this year, last [facility] showed she is not menopausal. Doing well on HRT to manage perimenopausal symptoms.  Gynecologic History No LMP recorded. (Menstrual status: IUD).   Contraception/Family planning: IUD Sexually active: yes  Last Pap: ?2023 or 2024 Last mammogram: 02/2023 [facility]  Obstetric History OB History  Gravida Para Term Preterm AB Living  2 2 2   2   SAB IAB Ectopic Multiple Live Births      2    # Outcome Date GA Lbr Len/2nd Weight Sex Type Anes PTL Lv  2 Term      Vag-Spont   LIV  1 Term      Vag-Spont   LIV      03/21/2024    3:15 PM 12/06/2023    2:14 PM  Depression screen PHQ 2/9  Decreased Interest 0 0  Down, Depressed, Hopeless 0 0  PHQ - 2 Score 0 0       The following portions of the patient's history were reviewed and updated as appropriate: allergies, current medications, past family history, past medical history, past social history, past surgical history, and problem list.  Review of Systems  All other systems reviewed and are negative.   Past medical history, past surgical history, family history and social history were all reviewed and documented in the EPIC chart.  Exam:  Vitals:   03/21/24 1511  BP: 98/64  Pulse: 83  SpO2: 98%  Weight: 150 lb (68 kg)  Height: 5' 5.5 (1.664 m)   Body mass index is 24.58 kg/m.  Physical Exam Vitals and nursing note reviewed. Exam conducted with a chaperone present.  Constitutional:      Appearance: Normal appearance. She is normal weight.  HENT:     Head: Normocephalic and atraumatic.  Neck:     Thyroid : No thyroid  mass, thyromegaly or thyroid  tenderness.  Cardiovascular:     Rate and Rhythm: Regular rhythm.     Heart sounds: Normal heart sounds.  Pulmonary:     Effort: Pulmonary effort is normal.     Breath sounds: Normal breath sounds.  Chest:  Breasts:    Breasts are  symmetrical.     Right: Normal. No inverted nipple, mass, nipple discharge, skin change or tenderness.     Left: Normal. No inverted nipple, mass, nipple discharge, skin change or tenderness.  Abdominal:     General: Abdomen is flat. Bowel sounds are normal.     Palpations: Abdomen is soft.  Genitourinary:    General: Normal vulva.     Vagina: Normal. No vaginal discharge, bleeding or lesions.     Cervix: Normal. No discharge or lesion.     Uterus: Normal. Not enlarged and not tender.      Adnexa: Right adnexa normal and left adnexa normal.       Right: No mass, tenderness or fullness.         Left: No mass, tenderness or fullness.    Lymphadenopathy:     Upper Body:     Right upper body: No axillary adenopathy.     Left upper body: No axillary adenopathy.  Skin:    General: Skin is warm and dry.  Neurological:     Mental Status: She is alert and oriented to person, place, and time.  Psychiatric:        Mood and Affect: Mood normal.  Thought Content: Thought content normal.        Judgment: Judgment normal.     Assessment/Plan:   1. Well woman exam with routine gynecological exam (Primary) - Cytology - PAP( [facility]) - Schedule mammogram - Labs with PCP  2. Perimenopausal symptoms - estradiol  (VIVELLE -DOT) 0.0375 MG/24HR; Place 1 patch onto the skin 2 (two) times a week.  Dispense: 24 patch; Refill: 4 - progesterone  (PROMETRIUM ) 100 MG capsule; Take 1 capsule (100 mg total) by mouth daily.  Dispense: 90 capsule; Refill: 4  3. Encounter for removal and reinsertion of intrauterine contraceptive device (IUD) - IUD Insertion; Future - IUD removal; Future  4. Stenotic cervical os - misoprostol (CYTOTEC) 200 MCG tablet; Place 2 tablets (400 mcg total) vaginally once for 1 dose. The night before procedure  Dispense: 2 tablet; Refill: 0  5. Depression screening     Return in about 1 year (around 03/21/2025) for Annual.  GINETTE COZIER B WHNP-BC 3:21 PM  03/21/2024

## 2024-03-22 LAB — CYTOLOGY - PAP
Adequacy: ABSENT
Comment: NEGATIVE
Diagnosis: NEGATIVE
High risk HPV: NEGATIVE

## 2024-03-25 ENCOUNTER — Ambulatory Visit: Payer: Self-pay | Admitting: Radiology

## 2024-05-24 NOTE — Telephone Encounter (Signed)
 Please advise. Pt is wondering if it is time to increase meds due to worsening hot flashes.  Currently using Vivelle  0.0375mg  & progesterone  100mg 

## 2024-05-28 ENCOUNTER — Other Ambulatory Visit: Payer: Self-pay | Admitting: Radiology

## 2024-05-28 DIAGNOSIS — N951 Menopausal and female climacteric states: Secondary | ICD-10-CM

## 2024-05-28 MED ORDER — ESTRADIOL 0.05 MG/24HR TD PTTW: 1.0000 | MEDICATED_PATCH | TRANSDERMAL | 0 refills | Status: DC

## 2024-05-28 NOTE — Telephone Encounter (Signed)
 Ok to increase to 0.05mg  #24 with zero refills.

## 2024-05-28 NOTE — Telephone Encounter (Signed)
 Med refill request: estradiol  0.0375 mg patch New Pharmacy:  [facility] Last AEX: 03/21/24 Next AEX: 05/29/24 Last MMG (if hormonal med) 03/09/23 BI-RADS 1 negative Refill authorized: estradiol  0.0375 mg patch.  RX at [facility] cancelled.

## 2024-05-29 ENCOUNTER — Encounter: Payer: Self-pay | Admitting: Radiology

## 2024-05-29 ENCOUNTER — Ambulatory Visit (INDEPENDENT_AMBULATORY_CARE_PROVIDER_SITE_OTHER): Admitting: Radiology

## 2024-05-29 VITALS — BP 104/58 | Wt 153.0 lb

## 2024-05-29 DIAGNOSIS — Z30433 Encounter for removal and reinsertion of intrauterine contraceptive device: Secondary | ICD-10-CM

## 2024-05-29 DIAGNOSIS — Z01812 Encounter for preprocedural laboratory examination: Secondary | ICD-10-CM

## 2024-05-29 LAB — PREGNANCY, URINE: Preg Test, Ur: NEGATIVE

## 2024-05-29 MED ORDER — LEVONORGESTREL 20 MCG/DAY IU IUD
1.0000 | INTRAUTERINE_SYSTEM | Freq: Once | INTRAUTERINE | Status: AC
  Administered 2024-05-29: 1 via INTRAUTERINE

## 2024-05-29 NOTE — Progress Notes (Signed)
" ° °  Heidi Hawkins August 15, 1970 [id number]   History:  54 y.o. G2P2 presents for removal and re insertion of Mirena  IUD.  Pt has been counseled about risks and benefits as well as complications.    No LMP recorded. (Menstrual status: IUD). GC/CT/Trich testing: obtained   Past medical history, past surgical history, family history and social history were all reviewed and documented in the EPIC chart.  ROS:  A ROS was performed and pertinent positives and negatives are included.  Time out performed and informed consent received.  Exam: Vitals:   05/29/24 1443  BP: (!) 104/58  Weight: 153 lb (69.4 kg)   Body mass index is 25.07 kg/m.  Pelvic exam: Straight catheterized using sterile technique as patient was unable to void for UPT x . Vulva:  normal female genitalia Vagina:  normal vagina, no discharge, exudate, lesion, or erythema Cervix:  Non-tender, Negative CMT, no lesions or redness. Uterus:  normal shape, position and consistency    Procedure:  Speculum inserted.   Cervix visualized and cleansed with Betadine x 3.  Tenaculum placed on anterior cervix. IUD removed with slight difficulty. Then uterus sounded to 8 cm with use of plastic dilator. IUD inserted easily. Strings trimmed to 3 cm.  Minimal bleeding noted.  Pt tolerated the procedure well.  Chaperone present: Darice Hoit, CMA   Assessment/Plan: 1. Encounter for removal and reinsertion of intrauterine contraceptive device (IUD) - IUD Insertion - Mirena   2. Encounter for preprocedural laboratory examination (Primary) - Pregnancy, urine; neg     Return for recheck 8 weeks Pt aware to call for any concerns Pt aware removal due no later than 05/29/2032, IUD card given to pt.   Analis Distler B WHNP-BC, 3:45 PM 05/29/2024  "

## 2024-05-30 LAB — SURESWAB CT/NG/T. VAGINALIS
C. trachomatis RNA, TMA: NOT DETECTED
N. gonorrhoeae RNA, TMA: NOT DETECTED
Trichomonas vaginalis RNA: NOT DETECTED

## 2024-05-31 ENCOUNTER — Ambulatory Visit: Payer: Self-pay | Admitting: Radiology

## 2024-06-03 NOTE — Telephone Encounter (Signed)
 Yes

## 2024-06-06 MED ORDER — ESTRADIOL 0.05 MG/24HR TD PTTW: 1.0000 | MEDICATED_PATCH | TRANSDERMAL | 0 refills | Status: AC

## 2024-06-06 NOTE — Telephone Encounter (Signed)
 Rx pended for estradiol  0.5 mg patch twice weekly #24/0RF to mail order, [facility].   Patient is scheduled for med f/u on 07/10/24.

## 2024-06-06 NOTE — Addendum Note (Signed)
 Addended by: BRUTUS KATE SAILOR on: 06/06/2024 09:36 AM   Modules accepted: Orders

## 2024-06-10 ENCOUNTER — Other Ambulatory Visit: Payer: Self-pay | Admitting: Radiology

## 2024-06-10 DIAGNOSIS — N951 Menopausal and female climacteric states: Secondary | ICD-10-CM

## 2024-06-10 NOTE — Telephone Encounter (Signed)
 Med refill request: Progesterone  (Prometrium ) 100 mg capsule Last AEX: 03/21/24 JC Next AEX: 07/10/24 JC Last MMG (if hormonal med) 03/09/23 Refill authorized: Please Advise? Last Rx sent #90 with 4 refills on 03/21/24 JC

## 2024-06-11 DIAGNOSIS — N951 Menopausal and female climacteric states: Secondary | ICD-10-CM

## 2024-06-12 MED ORDER — PROGESTERONE MICRONIZED 100 MG PO CAPS: 100.0000 mg | ORAL_CAPSULE | Freq: Every day | ORAL | 3 refills | Status: AC

## 2024-06-12 NOTE — Telephone Encounter (Signed)
 Medication refill request: progesterone  100mg  Last AEX:  03-21-24 Next AEX: not scheduled Last MMG (if hormonal medication request): 03-09-23 neg Refill authorized: Pt was given progesterone  10/25 for 1 yr to local pharmacy. [facility]. Pt had mirena  iud placed 05-29-24. Please approve if appropriate

## 2024-07-10 ENCOUNTER — Ambulatory Visit: Admitting: Radiology
